# Patient Record
Sex: Male | Born: 1980 | Race: Black or African American | Hispanic: No | Marital: Single | Smoking: Never smoker
Health system: Southern US, Community
[De-identification: ages and names within clinical notes are randomized; demographics above are authoritative.]

## PROBLEM LIST (undated history)

## (undated) HISTORY — PX: OTHER SURGICAL HISTORY: SHX169

---

## 2015-06-19 ENCOUNTER — Emergency Department (HOSPITAL_COMMUNITY)
Admission: EM | Admit: 2015-06-19 | Discharge: 2015-06-20 | Disposition: A | Discharge status: Home / Self Care | Payer: BLUE CROSS/BLUE SHIELD | Attending: Emergency Medicine | Admitting: Emergency Medicine

## 2015-06-19 ENCOUNTER — Encounter (HOSPITAL_COMMUNITY): Payer: Self-pay | Admitting: Emergency Medicine

## 2015-06-19 DIAGNOSIS — M549 Dorsalgia, unspecified: Secondary | ICD-10-CM

## 2015-06-19 DIAGNOSIS — M546 Pain in thoracic spine: Secondary | ICD-10-CM | POA: Diagnosis not present

## 2015-06-19 MED ORDER — SODIUM CHLORIDE 0.9 % IV SOLN
1000.0000 mL | Freq: Once | INTRAVENOUS | Status: AC
  Administered 2015-06-20: 1000 mL via INTRAVENOUS

## 2015-06-19 MED ORDER — MORPHINE SULFATE (PF) 4 MG/ML IV SOLN
4.0000 mg | Freq: Once | INTRAVENOUS | Status: AC
  Administered 2015-06-20: 4 mg via INTRAVENOUS
  Filled 2015-06-19: qty 1

## 2015-06-19 MED ORDER — SODIUM CHLORIDE 0.9 % IV SOLN
1000.0000 mL | INTRAVENOUS | Status: DC
  Administered 2015-06-20: 1000 mL via INTRAVENOUS

## 2015-06-19 MED ORDER — ONDANSETRON HCL 4 MG/2ML IJ SOLN
4.0000 mg | Freq: Once | INTRAMUSCULAR | Status: AC
  Administered 2015-06-20: 4 mg via INTRAVENOUS
  Filled 2015-06-19: qty 2

## 2015-06-19 NOTE — ED Notes (Signed)
Per ems-- pt reports 2 hours ago sudden onset of mid back pain. Pt denies any heavy lifting today- he sat at a desk today at work. Pt occasionally has mid chest pain. Pt unable to sit still in bed.

## 2015-06-19 NOTE — ED Notes (Signed)
Pt now saying pain radiates to R upper abdomen/rib cage.

## 2015-06-19 NOTE — ED Provider Notes (Signed)
CSN: [id number]     Arrival date & time 06/19/15  2325 History   This chart was scribed for Dione Booze, MD by Arlan Organ, ED Scribe. This patient was seen in room D35C/D35C and the patient's care was started 11:39 PM.   Chief Complaint  Patient presents with  . Back Pain   The history is provided by the patient. No language interpreter was used.    HPI Comments: Samuel Bradford brought in by EMS is a 34 y.o. male without any pertinent past medical history who presents to the Emergency Department complaining of constant, sudden onset mid back pain onset 2 hours prior to arrival. Currently pain is rated 8-9/10. Denies any trauma or heavy lifting today or in last few months. No aggravating or alleviating factors at this time. OTC Ibuprofen attempted prior to arrival without any improvement for pain. No recent fever, chills, nausea, or abdominal pain. No previous history of same. No family or personal history of heart disease. He is not an every day smoker. No known allergies to medications.   PCP: He is not currently followed by a PCP  History reviewed. No pertinent past medical history. Past Surgical History  Procedure Laterality Date  . Head surgery      fractured skull   No family history on file. Social History  Substance Use Topics  . Smoking status: Never Smoker   . Smokeless tobacco: None  . Alcohol Use: Yes    Review of Systems  Constitutional: Negative for fever and chills.  Respiratory: Negative for cough and shortness of breath.   Cardiovascular: Negative for chest pain.  Gastrointestinal: Negative for nausea, vomiting and abdominal pain.  Musculoskeletal: Positive for back pain. Negative for neck pain.  Neurological: Negative for weakness, numbness and headaches.  Psychiatric/Behavioral: Negative for confusion.  All other systems reviewed and are negative.     Allergies  Review of patient's allergies indicates no known allergies.  Home Medications    Prior to Admission medications   Not on File   Triage Vitals: BP 142/71 mmHg  Pulse 75  Temp(Src) 98.1 F (36.7 C) (Oral)  Resp 16  Ht 5\' 7"  (1.702 m)  SpO2 100%   Physical Exam  Constitutional: He is oriented to person, place, and time. He appears well-developed and well-nourished.  HENT:  Head: Normocephalic and atraumatic.  Eyes: EOM are normal.  Neck: Normal range of motion.  Cardiovascular: Normal rate, regular rhythm, normal heart sounds and intact distal pulses.   Pulmonary/Chest: Effort normal and breath sounds normal. No respiratory distress.  Abdominal: Soft. He exhibits no distension. There is no tenderness.  Musculoskeletal: Normal range of motion.  Positive straight leg raise bilaterally at 60 degrees. Moderate bilateral paraspinal spasm.  Neurological: He is alert and oriented to person, place, and time.  Skin: Skin is warm and dry.  Psychiatric: He has a normal mood and affect. Judgment normal.  Nursing note and vitals reviewed.   ED Course  Procedures (including critical care time)  DIAGNOSTIC STUDIES: Oxygen Saturation is 100% on RA, Normal by my interpretation.    COORDINATION OF CARE: 11:44 PM- Will order BMP, CBC, CXR, CT CTA abd/pel with CM and/or without CM, CT abio chest aurta with CM and/or without CM, EKG, and  i-stat troponin I. Discussed treatment plan with pt at bedside and pt agreed to plan.     Labs Review Results for orders placed or performed during the hospital encounter of 06/19/15  Basic metabolic panel  Result Value  Ref Range   Sodium 138 135 - 145 mmol/L   Potassium 4.2 3.5 - 5.1 mmol/L   Chloride 104 101 - 111 mmol/L   CO2 26 22 - 32 mmol/L   Glucose, Bld 103 (H) 65 - 99 mg/dL   BUN 9 6 - 20 mg/dL   Creatinine, Ser 4.09 0.61 - 1.24 mg/dL   Calcium 9.5 8.9 - 81.1 mg/dL   GFR calc non Af Amer >60 >60 mL/min   GFR calc Af Amer >60 >60 mL/min   Anion gap 8 5 - 15  CBC  Result Value Ref Range   WBC 8.3 4.0 - 10.5 K/uL   RBC  4.95 4.22 - 5.81 MIL/uL   Hemoglobin 13.5 13.0 - 17.0 g/dL   HCT 91.4 78.2 - 95.6 %   MCV 81.6 78.0 - 100.0 fL   MCH 27.3 26.0 - 34.0 pg   MCHC 33.4 30.0 - 36.0 g/dL   RDW 21.3 08.6 - 57.8 %   Platelets 174 150 - 400 K/uL  Troponin I  Result Value Ref Range   Troponin I <0.03 <0.031 ng/mL  Differential  Result Value Ref Range   Neutrophils Relative % 60 43 - 77 %   Neutro Abs 4.9 1.7 - 7.7 K/uL   Lymphocytes Relative 31 12 - 46 %   Lymphs Abs 2.5 0.7 - 4.0 K/uL   Monocytes Relative 7 3 - 12 %   Monocytes Absolute 0.5 0.1 - 1.0 K/uL   Eosinophils Relative 2 0 - 5 %   Eosinophils Absolute 0.1 0.0 - 0.7 K/uL   Basophils Relative 0 0 - 1 %   Basophils Absolute 0.0 0.0 - 0.1 K/uL  I-Stat CG4 Lactic Acid, ED  Result Value Ref Range   Lactic Acid, Venous 1.22 0.5 - 2.0 mmol/L  I-stat troponin, ED  Result Value Ref Range   Troponin i, poc 0.00 0.00 - 0.08 ng/mL   Comment 3            Imaging Review Ct Angio Chest Aorta W/cm &/or Wo/cm  06/20/2015   CLINICAL DATA:  34 year old male with sudden onset of mid back pain.  EXAM: CT ANGIOGRAPHY CHEST, ABDOMEN AND PELVIS  TECHNIQUE: Multidetector CT imaging through the chest, abdomen and pelvis was performed using the standard protocol during bolus administration of intravenous contrast. Multiplanar reconstructed images and MIPs were obtained and reviewed to evaluate the vascular anatomy.  CONTRAST:  OMNIPAQUE IOHEXOL 300 MG/ML  SOLN  COMPARISON:  None.  FINDINGS: CTA CHEST FINDINGS  There is a 3 mm nodule in the right middle lobe (series 4, image 36). The lungs are otherwise clear. The central airways are patent. There is trace right pleural effusion.  The thoracic aorta is unremarkable. No CT evidence of pulmonary embolism. There is no cardiomegaly or pericardial effusion. No hilar or mediastinal adenopathy. The thyroid gland is unremarkable. Esophagus is collapsed.  There is no axillary adenopathy. The chest wall soft tissues are  unremarkable. The osseous structures are intact.  Review of the MIP images confirms the above findings.  CTA ABDOMEN AND PELVIS FINDINGS  No intra-abdominal free air or free fluid.  The liver, gallbladder, pancreas, spleen, adrenal glands, left kidney and left ureter appear unremarkable. 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 There is no hydronephrosis on either side. The urinary bladder, prostate and seminal vesicles appear unremarkable.  Moderate stool throughout  the colon with no evidence of bowel obstruction or inflammation. Multiple normal caliber fecalized loops of small bowel noted suggestive of chronic stasis. Normal appendix.  The abdominal aorta and IVC appear unremarkable. There is no abdominal aortic aneurysm or dissection. The origins of the celiac axis, SMA, IMA as well as the origins of the renal arteries are patent. Accessory left renal arteries noted. The right renal arteries arise from the aortic bifurcation as well as from the right common iliac artery. There is no lymphadenopathy.  There is diastases of anterior abdominal wall musculature in the midline with a small fat containing umbilical hernia. Two small fat containing supraumbilical hernia is noted. The osseous structures appear unremarkable.  Review of the MIP images confirms the above findings.  IMPRESSION: No CT evidence of pulmonary embolism or aortic dissection.  Ectopic right kidney.   Electronically Signed   By: Elgie Collard M.D.   On: 06/20/2015 01:16   Ct Cta Abd/pel W/cm &/or W/o Cm  06/20/2015   CLINICAL DATA:  34 year old male with sudden onset of mid back pain.  EXAM: CT ANGIOGRAPHY CHEST, ABDOMEN AND PELVIS  TECHNIQUE: Multidetector CT imaging through the chest, abdomen and pelvis was performed using the standard protocol during bolus administration of intravenous contrast. Multiplanar reconstructed images and MIPs  were obtained and reviewed to evaluate the vascular anatomy.  CONTRAST:  OMNIPAQUE IOHEXOL 300 MG/ML  SOLN  COMPARISON:  None.  FINDINGS: CTA CHEST FINDINGS  There is a 3 mm nodule in the right middle lobe (series 4, image 36). The lungs are otherwise clear. The central airways are patent. There is trace right pleural effusion.  The thoracic aorta is unremarkable. No CT evidence of pulmonary embolism. There is no cardiomegaly or pericardial effusion. No hilar or mediastinal adenopathy. The thyroid gland is unremarkable. Esophagus is collapsed.  There is no axillary adenopathy. The chest wall soft tissues are unremarkable. The osseous structures are intact.  Review of the MIP images confirms the above findings.  CTA ABDOMEN AND PELVIS FINDINGS  No intra-abdominal free air or free fluid.  The liver, gallbladder, pancreas, spleen, adrenal glands, left kidney and left ureter appear unremarkable. 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 There is no hydronephrosis on either side. The urinary bladder, prostate and seminal vesicles appear unremarkable.  Moderate stool throughout the colon with no evidence of bowel obstruction or inflammation. Multiple normal caliber fecalized loops of small bowel noted suggestive of chronic stasis. Normal appendix.  The abdominal aorta and IVC appear unremarkable. There is no abdominal aortic aneurysm or dissection. The origins of the celiac axis, SMA, IMA as well as the origins of the renal arteries are patent. Accessory left renal arteries noted. The right renal arteries arise from the aortic bifurcation as well as from the right common iliac artery. There is no lymphadenopathy.  There is diastases of anterior abdominal wall musculature in the midline with a small fat containing umbilical hernia. Two small fat containing supraumbilical hernia is noted. The osseous structures appear  unremarkable.  Review of the MIP images confirms the above findings.  IMPRESSION: No CT evidence of pulmonary embolism or aortic dissection.  Ectopic right kidney.   Electronically Signed   By: Elgie Collard M.D.   On: 06/20/2015 01:16   I have personally reviewed and evaluated these images and lab results as part of my medical decision-making.   EKG Interpretation  Date/Time:  Tuesday June 19 2015 23:34:06 EDT Ventricular Rate:  74 PR Interval:  170 QRS Duration: 84 QT Interval:  351 QTC Calculation: 389 R Axis:   72 Text Interpretation:  Sinus rhythm Consider left ventricular hypertrophy  Abnormal T, consider ischemia, diffuse leads Anterior ST elevation,  probably due to LVH ED PHYSICIAN INTERPRETATION AVAILABLE IN [facility] Confirmed by TEST, Record ([id number]) on 06/20/2015 6:53:26 AM      MDM   Final diagnoses:  Mid-back pain, acute    Pain in the midback of uncertain cause. No obvious musculoskeletal injury to precipitated. Although patient does not appear to be at increased risk, I feel he needs to be screened for possible aortic dissection. He was given a dose of morphine for pain and sent for CT angiogram which showed no evidence of dissection. There was a single 3 mm nodule noted in the right lung. Did discuss this with the radiologist and a nodule this size in a patient this age with no smoking history and does not need follow-up. With negative scans, it is felt that this still represents musculoskeletal pain even with lack of obvious precipitating event. He is discharged with prescriptions for naproxen, orphenadrine, and oxycodone-acetaminophen.  I personally performed the services described in this documentation, which was scribed in my presence. The recorded information has been reviewed and is accurate.     Dione Booze, MD 06/20/15 [phone]

## 2015-06-20 ENCOUNTER — Encounter (HOSPITAL_COMMUNITY): Payer: Self-pay | Admitting: Radiology

## 2015-06-20 ENCOUNTER — Emergency Department (HOSPITAL_COMMUNITY): Payer: BLUE CROSS/BLUE SHIELD

## 2015-06-20 LAB — DIFFERENTIAL
BASOS ABS: 0 10*3/uL (ref 0.0–0.1)
BASOS PCT: 0 % (ref 0–1)
EOS ABS: 0.1 10*3/uL (ref 0.0–0.7)
Eosinophils Relative: 2 % (ref 0–5)
LYMPHS ABS: 2.5 10*3/uL (ref 0.7–4.0)
Lymphocytes Relative: 31 % (ref 12–46)
Monocytes Absolute: 0.5 10*3/uL (ref 0.1–1.0)
Monocytes Relative: 7 % (ref 3–12)
NEUTROS PCT: 60 % (ref 43–77)
Neutro Abs: 4.9 10*3/uL (ref 1.7–7.7)

## 2015-06-20 LAB — CBC
HCT: 40.4 % (ref 39.0–52.0)
Hemoglobin: 13.5 g/dL (ref 13.0–17.0)
MCH: 27.3 pg (ref 26.0–34.0)
MCHC: 33.4 g/dL (ref 30.0–36.0)
MCV: 81.6 fL (ref 78.0–100.0)
PLATELETS: 174 10*3/uL (ref 150–400)
RBC: 4.95 MIL/uL (ref 4.22–5.81)
RDW: 13 % (ref 11.5–15.5)
WBC: 8.3 10*3/uL (ref 4.0–10.5)

## 2015-06-20 LAB — BASIC METABOLIC PANEL
Anion gap: 8 (ref 5–15)
BUN: 9 mg/dL (ref 6–20)
CHLORIDE: 104 mmol/L (ref 101–111)
CO2: 26 mmol/L (ref 22–32)
CREATININE: 1.08 mg/dL (ref 0.61–1.24)
Calcium: 9.5 mg/dL (ref 8.9–10.3)
GFR calc non Af Amer: >60 mL/min (ref >60)
Glucose, Bld: 103 mg/dL — ABNORMAL HIGH (ref 65–99)
POTASSIUM: 4.2 mmol/L (ref 3.5–5.1)
SODIUM: 138 mmol/L (ref 135–145)

## 2015-06-20 LAB — I-STAT TROPONIN, ED: TROPONIN I, POC: 0 ng/mL (ref 0.00–0.08)

## 2015-06-20 LAB — TROPONIN I

## 2015-06-20 LAB — I-STAT CG4 LACTIC ACID, ED: Lactic Acid, Venous: 1.22 mmol/L (ref 0.5–2.0)

## 2015-06-20 MED ORDER — NAPROXEN 500 MG PO TABS: 500.0000 mg | ORAL_TABLET | Freq: Two times a day (BID) | ORAL | Status: DC

## 2015-06-20 MED ORDER — IBUPROFEN 800 MG PO TABS: 800.0000 mg | ORAL_TABLET | Freq: Once | ORAL | Status: DC

## 2015-06-20 MED ORDER — CYCLOBENZAPRINE HCL 10 MG PO TABS: 10.0000 mg | ORAL_TABLET | Freq: Once | ORAL | Status: DC

## 2015-06-20 MED ORDER — OXYCODONE-ACETAMINOPHEN 5-325 MG PO TABS: 1.0000 | ORAL_TABLET | ORAL | Status: DC | PRN

## 2015-06-20 MED ORDER — IOHEXOL 300 MG/ML  SOLN: 25.0000 mL | INTRAMUSCULAR | Status: AC

## 2015-06-20 MED ORDER — ORPHENADRINE CITRATE ER 100 MG PO TB12: 100.0000 mg | ORAL_TABLET | Freq: Two times a day (BID) | ORAL | Status: DC

## 2015-06-20 MED ORDER — IOHEXOL 300 MG/ML  SOLN
100.0000 mL | Freq: Once | INTRAMUSCULAR | Status: DC | PRN
  Administered 2015-06-20: 100 mL via INTRAVENOUS
  Filled 2015-06-20: qty 100

## 2015-06-20 NOTE — Discharge Instructions (Signed)
Back Pain, Adult °Low back pain is very common. About 1 in 5 people have back pain. The cause of low back pain is rarely dangerous. The pain often gets better over time. About half of people with a sudden onset of back pain feel better in just 2 weeks. About 8 in 10 people feel better by 6 weeks.  °CAUSES °Some common causes of back pain include: °· Strain of the muscles or ligaments supporting the spine. °· Wear and tear (degeneration) of the spinal discs. °· Arthritis. °· Direct injury to the back. °DIAGNOSIS °Most of the time, the direct cause of low back pain is not known. However, back pain can be treated effectively even when the exact cause of the pain is unknown. Answering your caregiver's questions about your overall health and symptoms is one of the most accurate ways to make sure the cause of your pain is not dangerous. If your caregiver needs more information, he or she may order lab work or imaging tests (X-rays or MRIs). However, even if imaging tests show changes in your back, this usually does not require surgery. °HOME CARE INSTRUCTIONS °For many people, back pain returns. Since low back pain is rarely dangerous, it is often a condition that people can learn to manage on their own.  °· Remain active. It is stressful on the back to sit or stand in one place. Do not sit, drive, or stand in one place for more than 30 minutes at a time. Take short walks on level surfaces as soon as pain allows. Try to increase the length of time you walk each day. °· Do not stay in bed. Resting more than 1 or 2 days can delay your recovery. °· Do not avoid exercise or work. Your body is made to move. It is not dangerous to be active, even though your back may hurt. Your back will likely heal faster if you return to being active before your pain is gone. °· Pay attention to your body when you  bend and lift. Many people have less discomfort when lifting if they bend their knees, keep the load close to their bodies, and  avoid twisting. Often, the most comfortable positions are those that put less stress on your recovering back. °· Find a comfortable position to sleep. Use a firm mattress and lie on your side with your knees slightly bent. If you lie on your back, put a pillow under your knees. °· Only take over-the-counter or prescription medicines as directed by your caregiver. Over-the-counter medicines to reduce pain and inflammation are often the most helpful. Your caregiver may prescribe muscle relaxant drugs. These medicines help dull your pain so you can more quickly return to your normal activities and healthy exercise. °· Put ice on the injured area. °¨ Put ice in a plastic bag. °¨ Place a towel between your skin and the bag. °¨ Leave the ice on for 15-20 minutes, 03-04 times a day for the first 2 to 3 days. After that, ice and heat may be alternated to reduce pain and spasms. °· Ask your caregiver about trying back exercises and gentle massage. This may be of some benefit. °· Avoid feeling anxious or stressed. Stress increases muscle tension and can worsen back pain. It is important to recognize when you are anxious or stressed and learn ways to manage it. Exercise is a great option. °SEEK MEDICAL CARE IF: °· You have pain that is not relieved with rest or medicine. °· You have pain that does not improve in 1 week. °· You have new symptoms. °· You are generally not feeling well. °SEEK   IMMEDIATE MEDICAL CARE IF:  °· You have pain that radiates from your back into your legs. °· You develop new bowel or bladder control problems. °· You have unusual weakness or numbness in your arms or legs. °· You develop nausea or vomiting. °· You develop abdominal pain. °· You feel faint. °Document Released: 09/29/2005 Document Revised: 03/30/2012 Document Reviewed: 01/31/2014 °[employer]® Patient Information ©2015 [employer]. This information is not intended to replace advice given to you by your health care provider. Make sure you  discuss any questions you have with your health care provider. ° °Naproxen and naproxen sodium oral immediate-release tablets °What is this medicine? °NAPROXEN (na PROX en) is a non-steroidal anti-inflammatory drug (NSAID). It is used to reduce swelling and to treat pain. This medicine may be used for dental pain, headache, or painful monthly periods. It is also used for painful joint and muscular problems such as arthritis, tendinitis, bursitis, and gout. °This medicine may be used for other purposes; ask your health care provider or pharmacist if you have questions. °COMMON BRAND NAME(S): Aflaxen, Aleve, Aleve Arthritis, All Day Relief, Anaprox, Anaprox DS, Naprosyn °What should I tell my health care provider before I take this medicine? °They need to know if you have any of these conditions: °-asthma °-cigarette smoker °-drink more than 3 alcohol containing drinks a day °-heart disease or circulation problems such as heart failure or leg edema (fluid retention) °-high blood pressure °-kidney disease °-liver disease °-stomach bleeding or ulcers °-an unusual or allergic reaction to naproxen, aspirin, other NSAIDs, other medicines, foods, dyes, or preservatives °-pregnant or trying to get pregnant °-breast-feeding °How should I use this medicine? °Take this medicine by mouth with a glass of water. Follow the directions on the prescription label. Take it with food if your stomach gets upset. Try to not lie down for at least 10 minutes after you take it. Take your medicine at regular intervals. Do not take your medicine more often than directed. Long-term, continuous use may increase the risk of heart attack or stroke. °A special MedGuide will be given to you by the pharmacist with each prescription and refill. Be sure to read this information carefully each time. °Talk to your pediatrician regarding the use of this medicine in children. Special care may be needed. °Overdosage: If you think you have taken too much of  this medicine contact a poison control center or emergency room at once. °NOTE: This medicine is only for you. Do not share this medicine with others. °What if I miss a dose? °If you miss a dose, take it as soon as you can. If it is almost time for your next dose, take only that dose. Do not take double or extra doses. °What may interact with this medicine? °-alcohol °-aspirin °-cidofovir °-diuretics °-lithium °-methotrexate °-other drugs for inflammation like ketorolac or prednisone °-pemetrexed °-probenecid °-warfarin °This list may not describe all possible interactions. Give your health care provider a list of all the medicines, herbs, non-prescription drugs, or dietary supplements you use. Also tell them if you smoke, drink alcohol, or use illegal drugs. Some items may interact with your medicine. °What should I watch for while using this medicine? °Tell your doctor or health care professional if your pain does not get better. Talk to your doctor before taking another medicine for pain. Do not treat yourself. °This medicine does not prevent heart attack or stroke. In fact, this medicine may increase the chance of a heart attack or stroke. The chance may increase   with longer use of this medicine and in people who have heart disease. If you take aspirin to prevent heart attack or stroke, talk with your doctor or health care professional. °Do not take other medicines that contain aspirin, ibuprofen, or naproxen with this medicine. Side effects such as stomach upset, nausea, or ulcers may be more likely to occur. Many medicines available without a prescription should not be taken with this medicine. °This medicine can cause ulcers and bleeding in the stomach and intestines at any time during treatment. Do not smoke cigarettes or drink alcohol. These increase irritation to your stomach and can make it more susceptible to damage from this medicine. Ulcers and bleeding can happen without warning symptoms and can cause  death. °You may get drowsy or dizzy. Do not drive, use machinery, or do anything that needs mental alertness until you know how this medicine affects you. Do not stand or sit up quickly, especially if you are an older patient. This reduces the risk of dizzy or fainting spells. °This medicine can cause you to bleed more easily. Try to avoid damage to your teeth and gums when you brush or floss your teeth. °What side effects may I notice from receiving this medicine? °Side effects that you should report to your doctor or health care professional as soon as possible: °-black or bloody stools, blood in the urine or vomit °-blurred vision °-chest pain °-difficulty breathing or wheezing °-nausea or vomiting °-severe stomach pain °-skin rash, skin redness, blistering or peeling skin, hives, or itching °-slurred speech or weakness on one side of the body °-swelling of eyelids, throat, lips °-unexplained weight gain or swelling °-unusually weak or tired °-yellowing of eyes or skin °Side effects that usually do not require medical attention (report to your doctor or health care professional if they continue or are bothersome): °-constipation °-headache °-heartburn °This list may not describe all possible side effects. Call your doctor for medical advice about side effects. You may report side effects to FDA at 1-800-FDA-1088. °Where should I keep my medicine? °Keep out of the reach of children. °Store at room temperature between 15 and 30 degrees C (59 and 86 degrees F). Keep container tightly closed. Throw away any unused medicine after the expiration date. °NOTE: This sheet is a summary. It may not cover all possible information. If you have questions about this medicine, talk to your doctor, pharmacist, or health care provider. °© 2015, [employer]. (2009-10-01 20:10:16) ° °Orphenadrine tablets °What is this medicine? °ORPHENADRINE (or FEN a dreen) helps to relieve pain and stiffness in muscles and can treat  muscle spasms. °This medicine may be used for other purposes; ask your health care provider or pharmacist if you have questions. °COMMON BRAND NAME(S): Norflex °What should I tell my health care provider before I take this medicine? °They need to know if you have any of these conditions: °-glaucoma °-heart disease °-kidney disease °-myasthenia gravis °-peptic ulcer disease °-prostate disease °-stomach problems °-an unusual or allergic reaction to orphenadrine, other medicines, foods, lactose, dyes, or preservatives °-pregnant or trying to get pregnant °-breast-feeding °How should I use this medicine? °Take this medicine by mouth with a full glass of water. Follow the directions on the prescription label. Take your medicine at regular intervals. Do not take your medicine more often than directed. Do not take more than you are told to take. °Talk to your pediatrician regarding the use of this medicine in children. Special care may be needed. °Patients over 65 years old   may have a stronger reaction and need a smaller dose. °Overdosage: If you think you have taken too much of this medicine contact a poison control center or emergency room at once. °NOTE: This medicine is only for you. Do not share this medicine with others. °What if I miss a dose? °If you miss a dose, take it as soon as you can. If it is almost time for your next dose, take only that dose. Do not take double or extra doses. °What may interact with this medicine? °-alcohol °-antihistamines °-barbiturates, like phenobarbital °-benzodiazepines °-cyclobenzaprine °-medicines for pain °-phenothiazines like chlorpromazine, mesoridazine, prochlorperazine, thioridazine °This list may not describe all possible interactions. Give your health care provider a list of all the medicines, herbs, non-prescription drugs, or dietary supplements you use. Also tell them if you smoke, drink alcohol, or use illegal drugs. Some items may interact with your medicine. °What  should I watch for while using this medicine? °Your mouth may get dry. Chewing sugarless gum or sucking hard candy, and drinking plenty of water may help. Contact your doctor if the problem does not go away or is severe. °This medicine may cause dry eyes and blurred vision. If you wear contact lenses you may feel some discomfort. Lubricating drops may help. See your eye doctor if the problem does not go away or is severe. °You may get drowsy or dizzy. Do not drive, use machinery, or do anything that needs mental alertness until you know how this medicine affects you. Do not stand or sit up quickly, especially if you are an older patient. This reduces the risk of dizzy or fainting spells. Alcohol may interfere with the effect of this medicine. Avoid alcoholic drinks. °What side effects may I notice from receiving this medicine? °Side effects that you should report to your doctor or health care professional as soon as possible: °-allergic reactions like skin rash, itching or hives, swelling of the face, lips, or tongue °-changes in vision °-difficulty breathing °-fast heartbeat or palpitations °-hallucinations °-light headedness, fainting spells °-vomiting °Side effects that usually do not require medical attention (report to your doctor or health care professional if they continue or are bothersome): °-dizziness °-drowsiness °-headache °-nausea °This list may not describe all possible side effects. Call your doctor for medical advice about side effects. You may report side effects to FDA at 1-800-FDA-1088. °Where should I keep my medicine? °Keep out of the reach of children. °Store at room temperature between 15 and 30 degrees C (59 and 86 degrees F). Protect from light. Keep container tightly closed. Throw away any unused medicine after the expiration date. °NOTE: This sheet is a summary. It may not cover all possible information. If you have questions about this medicine, talk to your doctor, pharmacist, or health  care provider. °© 2015, [employer]. (2008-04-25 17:19:12) ° °Acetaminophen; Oxycodone tablets °What is this medicine? °ACETAMINOPHEN; OXYCODONE (a set a MEE noe fen; ox i KOE done) is a pain reliever. It is used to treat mild to moderate pain. °This medicine may be used for other purposes; ask your health care provider or pharmacist if you have questions. °COMMON BRAND NAME(S): Endocet, Magnacet, Narvox, Percocet, Perloxx, Primalev, Primlev, Roxicet, Xolox °What should I tell my health care provider before I take this medicine? °They need to know if you have any of these conditions: °-brain tumor °-Crohn's disease, inflammatory bowel disease, or ulcerative colitis °-drug abuse or addiction °-head injury °-heart or circulation problems °-if you often drink alcohol °-kidney disease or problems   going to the bathroom °-liver disease °-lung disease, asthma, or breathing problems °-an unusual or allergic reaction to acetaminophen, oxycodone, other opioid analgesics, other medicines, foods, dyes, or preservatives °-pregnant or trying to get pregnant °-breast-feeding °How should I use this medicine? °Take this medicine by mouth with a full glass of water. Follow the directions on the prescription label. Take your medicine at regular intervals. Do not take your medicine more often than directed. °Talk to your pediatrician regarding the use of this medicine in children. Special care may be needed. °Patients over 65 years old may have a stronger reaction and need a smaller dose. °Overdosage: If you think you have taken too much of this medicine contact a poison control center or emergency room at once. °NOTE: This medicine is only for you. Do not share this medicine with others. °What if I miss a dose? °If you miss a dose, take it as soon as you can. If it is almost time for your next dose, take only that dose. Do not take double or extra doses. °What may interact with this  medicine? °-alcohol °-antihistamines °-barbiturates like amobarbital, butalbital, butabarbital, methohexital, pentobarbital, phenobarbital, thiopental, and secobarbital °-benztropine °-drugs for bladder problems like solifenacin, trospium, oxybutynin, tolterodine, hyoscyamine, and methscopolamine °-drugs for breathing problems like ipratropium and tiotropium °-drugs for certain stomach or intestine problems like propantheline, homatropine methylbromide, glycopyrrolate, atropine, belladonna, and dicyclomine °-general anesthetics like etomidate, ketamine, nitrous oxide, propofol, desflurane, enflurane, halothane, isoflurane, and sevoflurane °-medicines for depression, anxiety, or psychotic disturbances °-medicines for sleep °-muscle relaxants °-naltrexone °-narcotic medicines (opiates) for pain °-phenothiazines like perphenazine, thioridazine, chlorpromazine, mesoridazine, fluphenazine, prochlorperazine, promazine, and trifluoperazine °-scopolamine °-tramadol °-trihexyphenidyl °This list may not describe all possible interactions. Give your health care provider a list of all the medicines, herbs, non-prescription drugs, or dietary supplements you use. Also tell them if you smoke, drink alcohol, or use illegal drugs. Some items may interact with your medicine. °What should I watch for while using this medicine? °Tell your doctor or health care professional if your pain does not go away, if it gets worse, or if you have new or a different type of pain. You may develop tolerance to the medicine. Tolerance means that you will need a higher dose of the medication for pain relief. Tolerance is normal and is expected if you take this medicine for a long time. °Do not suddenly stop taking your medicine because you may develop a severe reaction. Your body becomes used to the medicine. This does NOT mean you are addicted. Addiction is a behavior related to getting and using a drug for a non-medical reason. If you have pain, you  have a medical reason to take pain medicine. Your doctor will tell you how much medicine to take. If your doctor wants you to stop the medicine, the dose will be slowly lowered over time to avoid any side effects. °You may get drowsy or dizzy. Do not drive, use machinery, or do anything that needs mental alertness until you know how this medicine affects you. Do not stand or sit up quickly, especially if you are an older patient. This reduces the risk of dizzy or fainting spells. Alcohol may interfere with the effect of this medicine. Avoid alcoholic drinks. °There are different types of narcotic medicines (opiates) for pain. If you take more than one type at the same time, you may have more side effects. Give your health care provider a list of all medicines you use. Your doctor will tell you how   much medicine to take. Do not take more medicine than directed. Call emergency for help if you have problems breathing. °The medicine will cause constipation. Try to have a bowel movement at least every 2 to 3 days. If you do not have a bowel movement for 3 days, call your doctor or health care professional. °Do not take Tylenol (acetaminophen) or medicines that have acetaminophen with this medicine. Too much acetaminophen can be very dangerous. Many nonprescription medicines contain acetaminophen. Always read the labels carefully to avoid taking more acetaminophen. °What side effects may I notice from receiving this medicine? °Side effects that you should report to your doctor or health care professional as soon as possible: °-allergic reactions like skin rash, itching or hives, swelling of the face, lips, or tongue °-breathing difficulties, wheezing °-confusion °-light headedness or fainting spells °-severe stomach pain °-unusually weak or tired °-yellowing of the skin or the whites of the eyes °Side effects that usually do not require medical attention (report to your doctor or health care professional if they continue  or are bothersome): °-dizziness °-drowsiness °-nausea °-vomiting °This list may not describe all possible side effects. Call your doctor for medical advice about side effects. You may report side effects to FDA at 1-800-FDA-1088. °Where should I keep my medicine? °Keep out of the reach of children. This medicine can be abused. Keep your medicine in a safe place to protect it from theft. Do not share this medicine with anyone. Selling or giving away this medicine is dangerous and against the law. °Store at room temperature between 20 and 25 degrees C (68 and 77 degrees F). Keep container tightly closed. Protect from light. °This medicine may cause accidental overdose and death if it is taken by other adults, children, or pets. Flush any unused medicine down the toilet to reduce the chance of harm. Do not use the medicine after the expiration date. °NOTE: This sheet is a summary. It may not cover all possible information. If you have questions about this medicine, talk to your doctor, pharmacist, or health care provider. °© 2015, [employer]. (2013-05-23 13:17:35) ° °

## 2015-06-20 NOTE — ED Notes (Signed)
Patient transported to CT 

## 2017-03-17 ENCOUNTER — Encounter: Payer: Self-pay | Admitting: Urgent Care

## 2017-03-17 ENCOUNTER — Ambulatory Visit (INDEPENDENT_AMBULATORY_CARE_PROVIDER_SITE_OTHER): Payer: Worker's Compensation

## 2017-03-17 ENCOUNTER — Ambulatory Visit (INDEPENDENT_AMBULATORY_CARE_PROVIDER_SITE_OTHER): Payer: Worker's Compensation | Admitting: Urgent Care

## 2017-03-17 VITALS — BP 121/67 | HR 79 | Temp 97.4°F | Resp 16 | Ht 65.5 in | Wt 187.2 lb

## 2017-03-17 DIAGNOSIS — M545 Low back pain, unspecified: Secondary | ICD-10-CM

## 2017-03-17 DIAGNOSIS — M62830 Muscle spasm of back: Secondary | ICD-10-CM | POA: Diagnosis not present

## 2017-03-17 DIAGNOSIS — S39012A Strain of muscle, fascia and tendon of lower back, initial encounter: Secondary | ICD-10-CM | POA: Diagnosis not present

## 2017-03-17 MED ORDER — CYCLOBENZAPRINE HCL 5 MG PO TABS: 5.0000 mg | ORAL_TABLET | Freq: Three times a day (TID) | ORAL | 1 refills | Status: AC | PRN

## 2017-03-17 MED ORDER — NAPROXEN SODIUM 550 MG PO TABS: 550.0000 mg | ORAL_TABLET | Freq: Two times a day (BID) | ORAL | 1 refills | Status: AC

## 2017-03-17 NOTE — Patient Instructions (Addendum)
Low Back Sprain A sprain is a stretch or tear in the bands of tissue that hold bones and joints together (ligaments). Sprains of the lower back (lumbar spine) are a common cause of low back pain. A sprain occurs when ligaments are overextended or stretched beyond their limits. The ligaments can become inflamed, resulting in pain and sudden muscle tightening (spasms). A sprain can be caused by an injury (trauma), or it can develop gradually due to overuse. There are three types of sprains:  Grade 1 is a mild sprain involving an overstretched ligament or a very slight tear of the ligament.  Grade 2 is a moderate sprain involving a partial tear of the ligament.  Grade 3 is a severe sprain involving a complete tear of the ligament.  What are the causes? This condition may be caused by:  Trauma, such as a fall or a hit to the body.  Twisting or overstretching the back. This may result from doing activities that require a lot of energy, such as lifting heavy objects.  What increases the risk? The following factors may increase your risk of getting this condition:  Playing contact sports.  Participating in sports or activities that put excessive stress on the back and require a lot of bending and twisting, including: ? Lifting weights or heavy objects. ? Gymnastics. ? Soccer. ? Figure skating. ? Snowboarding.  Being overweight or obese.  Having poor strength and flexibility.  What are the signs or symptoms? Symptoms of this condition may include:  Sharp or dull pain in the lower back that does not go away. Pain may extend to the buttocks.  Stiffness.  Limited range of motion.  Inability to stand up straight due to stiffness or pain.  Muscle spasms.  How is this diagnosed?  This condition may be diagnosed based on:  Your symptoms.  Your medical history.  A physical exam. ? Your health care provider may push on certain areas of your back to determine the source of your  pain. ? You may be asked to bend forward, backward, and side to side to assess the severity of your pain and your range of motion.  Imaging tests, such as: ? X-rays. ? MRI.  How is this treated? Treatment for this condition may include:  Applying heat and cold to the affected area.  Medicines to help relieve pain and to relax your muscles (muscle relaxants).  NSAIDs to help reduce swelling and discomfort.  Physical therapy.  When your symptoms improve, it is important to gradually return to your normal routine as soon as possible to reduce pain, avoid stiffness, and avoid loss of muscle strength. Generally, symptoms should improve within 6 weeks of treatment. However, recovery time varies. Follow these instructions at home: Managing pain, stiffness, and swelling  If directed, apply ice to the injured area during the first 24 hours after your injury. ? Put ice in a plastic bag. ? Place a towel between your skin and the bag. ? Leave the ice on for 20 minutes, 2-3 times a day.  If directed, apply heat to the affected area as often as told by your health care provider. Use the heat source that your health care provider recommends, such as a moist heat pack or a heating pad. ? Place a towel between your skin and the heat source. ? Leave the heat on for 20-30 minutes. ? Remove the heat if your skin turns bright red. This is especially important if you are unable to feel pain,  heat, or cold. You may have a greater risk of getting burned. Activity  Rest and return to your normal activities as told by your health care provider. Ask your health care provider what activities are safe for you.  Avoid activities that take a lot of effort (are strenuous) for as long as told by your health care provider.  Do exercises as told by your health care provider. General instructions   Take over-the-counter and prescription medicines only as told by your health care provider.  If you have  questions or concerns about safety while taking pain medicine, talk with your health care provider.  Do not drive or operate heavy machinery until you know how your pain medicine affects you.  Do not use any tobacco products, such as cigarettes, chewing tobacco, and e-cigarettes. Tobacco can delay bone healing. If you need help quitting, ask your health care provider.  Keep all follow-up visits as told by your health care provider. This is important. How is this prevented?  Warm up and stretch before being active.  Cool down and stretch after being active.  Give your body time to rest between periods of activity.  Avoid: ? Being physically inactive for long periods at a time. ? Exercising or playing sports when you are tired or in pain.  Use correct form when playing sports and lifting heavy objects.  Use good posture when sitting and standing.  Maintain a healthy weight.  Sleep on a mattress with medium firmness to support your back.  Make sure to use equipment that fits you, including shoes that fit well.  Be safe and responsible while being active to avoid falls.  Do at least 150 minutes of moderate-intensity exercise each week, such as brisk walking or water aerobics. Try a form of exercise that takes stress off your back, such as swimming or stationary cycling.  Maintain physical fitness, including: ? Strength. In particular, develop and maintain strong abdominal muscles. ? Flexibility. ? Cardiovascular fitness. ? Endurance. Contact a health care provider if:  Your back pain does not improve after 6 weeks of treatment.  Your symptoms get worse. Get help right away if:  Your back pain is severe.  You are unable to stand or walk.  You develop pain in your legs.  You develop weakness in your buttocks or legs.  You have difficulty controlling when you urinate or when you have a bowel movement. This information is not intended to replace advice given to you by  your health care provider. Make sure you discuss any questions you have with your health care provider. Document Released: 09/29/2005 Document Revised: 06/05/2016 Document Reviewed: 07/11/2015 Elsevier Interactive Patient Education  2018 [employer]     IF you received an x-ray today, you will receive an invoice from [facility]. Please contact [facility] at [phone] with questions or concerns regarding your invoice.   IF you received labwork today, you will receive an invoice from [facility]. Please contact [facility] at [phone] with questions or concerns regarding your invoice.   Our billing staff will not be able to assist you with questions regarding bills from these companies.  You will be contacted with the lab results as soon as they are available. The fastest way to get your results is to activate your My Chart account. Instructions are located on the last page of this paperwork. If you have not heard from us regarding the results in 2 weeks, please contact this office.

## 2017-03-17 NOTE — Progress Notes (Signed)
   MRN: [id number] DOB: 11/24/1980  Subjective:   Pete Peltrimiyau B Philippi is a 36 y.o. male presenting for worker's comp visit.   Reports suffering low back injury while lifting a washer while at work on 03/07/2017. He felt immediate sharp pain, hot sensation of his back. Patient notified his manager, was allowed to rest off the floor for a few minutes. When he returned to work his back pain worsened. The pain has now progressed to radiate down into his right lateral thigh. Has tried ibuprofen, otc NSAID medications for relief. Denies falls, trauma, numbness or tingling, weakness.   Bryson's medications list, allergies, past medical history and past surgical history were reviewed and excluded from this note due to being a worker's comp case.  Objective:   Vitals: BP 121/67 (BP Location: Right Arm, Patient Position: Sitting, Cuff Size: Normal)   Pulse 79   Temp 97.4 F (36.3 C) (Oral)   Resp 16   Ht 5' 5.5" (1.664 m)   Wt 187 lb 3.2 oz (84.9 kg)   SpO2 95%   BMI 30.68 kg/m   Physical Exam  Constitutional: He is oriented to person, place, and time. He appears well-developed and well-nourished.  Cardiovascular: Normal rate.   Pulmonary/Chest: Effort normal.  Musculoskeletal:       Lumbar back: He exhibits decreased range of motion (flexion, extension), tenderness (over entire lower back, worst over lumbar paraspinal muscles) and spasm. He exhibits no bony tenderness, no swelling, no edema, no deformity and no laceration.  Neurological: He is alert and oriented to person, place, and time. He displays normal reflexes. Coordination (favoring back significantly, rising slowly from seated position, lays down on exam table with pain) abnormal.   Dg Lumbar Spine Complete  Result Date: 03/17/2017 CLINICAL DATA:  Acute bilateral low back pain without sciatica EXAM: LUMBAR SPINE - COMPLETE 4+ VIEW COMPARISON:  Coronal and sagittal images through the lumbar spine from an abdominal and pelvic CT scan  dated June 20, 2015 FINDINGS: The lumbar vertebral bodies are preserved in height. The pedicles and transverse processes are intact. The disc space heights are well maintained. There is no spondylolisthesis. The observed portions of the sacrum are normal. IMPRESSION: There is no acute or significant chronic bony abnormality of the lumbar spine. Electronically Signed   By: David  SwazilandJordan M.D.   On: 03/17/2017 16:28    Assessment and Plan :   1. Strain of lumbar region, initial encounter 2. Acute bilateral low back pain without sciatica 3. Spasm of muscle of lower back - Radiology report reassuring. Will start conservative management with Anaprox, Flexeril. Work restrictions provided. Patient to f/u in 2 weeks or sooner if no improvement/worsening symptoms. Consider referral to ortho at that point for consideration of further imaging or PT.  Wallis BambergMario Janeshia Ciliberto, PA-C [facility] [phone] 03/17/2017 4:18 PM

## 2017-03-21 ENCOUNTER — Encounter: Payer: Self-pay | Admitting: Physician Assistant

## 2017-03-21 ENCOUNTER — Ambulatory Visit (INDEPENDENT_AMBULATORY_CARE_PROVIDER_SITE_OTHER): Payer: BLUE CROSS/BLUE SHIELD | Admitting: Physician Assistant

## 2017-03-21 VITALS — BP 128/75 | HR 70 | Temp 98.2°F | Resp 20 | Ht 66.5 in | Wt 185.5 lb

## 2017-03-21 DIAGNOSIS — Z113 Encounter for screening for infections with a predominantly sexual mode of transmission: Secondary | ICD-10-CM

## 2017-03-21 DIAGNOSIS — Z114 Encounter for screening for human immunodeficiency virus [HIV]: Secondary | ICD-10-CM | POA: Diagnosis not present

## 2017-03-21 DIAGNOSIS — Z1389 Encounter for screening for other disorder: Secondary | ICD-10-CM | POA: Diagnosis not present

## 2017-03-21 DIAGNOSIS — Z1159 Encounter for screening for other viral diseases: Secondary | ICD-10-CM

## 2017-03-21 DIAGNOSIS — Z130 Encounter for screening for diseases of the blood and blood-forming organs and certain disorders involving the immune mechanism: Secondary | ICD-10-CM

## 2017-03-21 DIAGNOSIS — Z0000 Encounter for general adult medical examination without abnormal findings: Secondary | ICD-10-CM

## 2017-03-21 DIAGNOSIS — Z131 Encounter for screening for diabetes mellitus: Secondary | ICD-10-CM | POA: Diagnosis not present

## 2017-03-21 LAB — POCT URINALYSIS DIP (MANUAL ENTRY)
Bilirubin, UA: NEGATIVE
GLUCOSE UA: NEGATIVE mg/dL
Ketones, POC UA: NEGATIVE mg/dL
Leukocytes, UA: NEGATIVE
NITRITE UA: NEGATIVE
PH UA: 7 (ref 5.0–8.0)
Protein Ur, POC: NEGATIVE mg/dL
RBC UA: NEGATIVE
Spec Grav, UA: 1.02 (ref 1.010–1.025)
UROBILINOGEN UA: 0.2 U/dL

## 2017-03-21 NOTE — Progress Notes (Signed)
03/21/2017 9:09 AM   DOB: Dec 11, 1980 / MRN: [id number]  SUBJECTIVE:  Samuel Bradford is a 36 y.o. male presenting for an annual physical.  He was recently told he has "hepatitis."  He does not know what type of hepatitis this may be. He has had an annual physical.  He feels well today. He likes to exercise via jogging and weight lifting.  He does these activities 3 times weekly for 1-2 hours. Family history of DM. He is sexually active with females only.  He does not smoke.  Denies a history of IV drug use. He works at [employer] and is full time and works about 40 hours weekly. He says that he a happy person.  He does drink but not daily.  He may have a drink 1-2 times weekly, but does not get drunk. He drinks spirits only and may have 4-5 shots in one sitting. No family history of HTN, CAD, stroke. He has been largely healthy.  Did have an head injury 17 years ago.   Depression screen PHQ 2/9 03/21/2017  Decreased Interest 0  Down, Depressed, Hopeless 0  PHQ - 2 Score 0   He has No Known Allergies.   He  has no past medical history on file.    He  reports that he has never smoked. He has never used smokeless tobacco. He reports that he drinks alcohol. He reports that he does not use drugs.  The patient  has a past surgical history that includes head surgery.  His family history includes Diabetes in his father.  Review of Systems  Constitutional: Negative for chills, diaphoresis and fever.  Eyes: Negative.   Respiratory: Negative for cough, hemoptysis, sputum production, shortness of breath and wheezing.   Cardiovascular: Negative for chest pain, orthopnea and leg swelling.  Gastrointestinal: Negative for abdominal pain, blood in stool, constipation, diarrhea, heartburn, melena, nausea and vomiting.  Genitourinary: Negative for dysuria, flank pain, frequency, hematuria and urgency.  Skin: Negative for rash.  Neurological: Negative for dizziness, sensory change, speech  change, focal weakness and headaches.    The problem list and medications were reviewed and updated by myself where necessary and exist elsewhere in the encounter.   OBJECTIVE:  BP 128/75   Pulse 70   Temp 98.2 F (36.8 C) (Oral)   Resp 20   Ht 5' 6.5" (1.689 m)   Wt 185 lb 8 oz (84.1 kg)   SpO2 99%   BMI 29.49 kg/m   Physical Exam  Constitutional: He is oriented to person, place, and time. He appears well-developed. He is active and cooperative.  Non-toxic appearance.  HENT:  Right Ear: Hearing, tympanic membrane, external ear and ear canal normal.  Left Ear: Hearing, tympanic membrane, external ear and ear canal normal.  Nose: Nose normal. Right sinus exhibits no maxillary sinus tenderness and no frontal sinus tenderness. Left sinus exhibits no maxillary sinus tenderness and no frontal sinus tenderness.  Mouth/Throat: Uvula is midline, oropharynx is clear and moist and mucous membranes are normal. No oropharyngeal exudate, posterior oropharyngeal edema or tonsillar abscesses.  Eyes: Conjunctivae and EOM are normal. Pupils are equal, round, and reactive to light.  Cardiovascular: Normal rate, regular rhythm, S1 normal, S2 normal, normal heart sounds, intact distal pulses and normal pulses.  Exam reveals no gallop and no friction rub.   No murmur heard. Pulmonary/Chest: Effort normal. No stridor. No tachypnea. No respiratory distress. He has no wheezes. He has no rales.  Abdominal: Soft.  Normal appearance and bowel sounds are normal. He exhibits no distension and no mass. There is no tenderness. There is no rigidity, no rebound, no guarding and no CVA tenderness. No hernia.  Musculoskeletal: He exhibits no edema.  Lymphadenopathy:       Head (right side): No submandibular and no tonsillar adenopathy present.       Head (left side): No submandibular and no tonsillar adenopathy present.    He has no cervical adenopathy.  Neurological: He is alert and oriented to person, place, and  time. He has normal strength and normal reflexes. He is not disoriented. No cranial nerve deficit or sensory deficit. He exhibits normal muscle tone. Coordination and gait normal.  Skin: Skin is warm and dry. He is not diaphoretic. No pallor.  Psychiatric: His behavior is normal.  Vitals reviewed.   No results found for this or any previous visit (from the past 72 hour(s)).  No results found.  ASSESSMENT AND PLAN:  Anis was seen today for annual exam.  Diagnoses and all orders for this visit:  Annual physical exam: Healthy male with reported history of hepatitis, however no information exist to why.  Labs out.  He will sign up for mychart.  Need for hepatitis B screening test -     Acute Hep Panel & Hep B Surface Ab  Screening for HIV (human immunodeficiency virus) -     HIV antibody  Screening for deficiency anemia -     CBC  Screening for nephropathy -     CMP14+EGFR -     POCT urinalysis dipstick  Screening for diabetes mellitus -     Hemoglobin A1c  Routine screening for STI (sexually transmitted infection) -     GC/Chlamydia Probe Amp -     Trichomonas vaginalis, RNA    The patient is advised to call or return to clinic if he does not see an improvement in symptoms, or to seek the care of the closest emergency department if he worsens with the above plan.   Philis Fendt, MHS, PA-C [facility] 03/21/2017 9:09 AM

## 2017-03-21 NOTE — Patient Instructions (Signed)
     IF you received an x-ray today, you will receive an invoice from [facility]. Please contact [facility] at [phone] with questions or concerns regarding your invoice.   IF you received labwork today, you will receive an invoice from [facility]. Please contact [facility] at [phone] with questions or concerns regarding your invoice.   Our billing staff will not be able to assist you with questions regarding bills from these companies.  You will be contacted with the lab results as soon as they are available. The fastest way to get your results is to activate your My Chart account. Instructions are located on the last page of this paperwork. If you have not heard from us regarding the results in 2 weeks, please contact this office.     

## 2017-03-22 LAB — CBC
Hematocrit: 42.7 % (ref 37.5–51.0)
Hemoglobin: 14 g/dL (ref 13.0–17.7)
MCH: 26.6 pg (ref 26.6–33.0)
MCHC: 32.8 g/dL (ref 31.5–35.7)
MCV: 81 fL (ref 79–97)
PLATELETS: 184 10*3/uL (ref 150–379)
RBC: 5.26 x10E6/uL (ref 4.14–5.80)
RDW: 13.8 % (ref 12.3–15.4)
WBC: 4.1 10*3/uL (ref 3.4–10.8)

## 2017-03-22 LAB — CMP14+EGFR
A/G RATIO: 1.9 (ref 1.2–2.2)
ALK PHOS: 101 IU/L (ref 39–117)
ALT: 45 IU/L — AB (ref 0–44)
AST: 22 IU/L (ref 0–40)
Albumin: 4.8 g/dL (ref 3.5–5.5)
BILIRUBIN TOTAL: 0.7 mg/dL (ref 0.0–1.2)
BUN/Creatinine Ratio: 14 (ref 9–20)
BUN: 15 mg/dL (ref 6–20)
CHLORIDE: 100 mmol/L (ref 96–106)
CO2: 25 mmol/L (ref 18–29)
Calcium: 9.7 mg/dL (ref 8.7–10.2)
Creatinine, Ser: 1.06 mg/dL (ref 0.76–1.27)
GFR calc non Af Amer: 89 mL/min/{1.73_m2} (ref >59)
GFR, EST AFRICAN AMERICAN: 103 mL/min/{1.73_m2} (ref >59)
GLUCOSE: 84 mg/dL (ref 65–99)
Globulin, Total: 2.5 g/dL (ref 1.5–4.5)
POTASSIUM: 4.4 mmol/L (ref 3.5–5.2)
Sodium: 139 mmol/L (ref 134–144)
Total Protein: 7.3 g/dL (ref 6.0–8.5)

## 2017-03-22 LAB — HIV ANTIBODY (ROUTINE TESTING W REFLEX): HIV Screen 4th Generation wRfx: NONREACTIVE

## 2017-03-22 LAB — ACUTE HEP PANEL AND HEP B SURFACE AB
HEP A IGM: NEGATIVE
HEP B C IGM: NEGATIVE
HEP B S AG: NEGATIVE
HEPATITIS B SURF AB QUANT: 111.7 m[IU]/mL

## 2017-03-22 LAB — HEMOGLOBIN A1C
Est. average glucose Bld gHb Est-mCnc: 123 mg/dL
HEMOGLOBIN A1C: 5.9 % — AB (ref 4.8–5.6)

## 2017-03-24 LAB — GC/CHLAMYDIA PROBE AMP
CHLAMYDIA, DNA PROBE: NEGATIVE
NEISSERIA GONORRHOEAE BY PCR: NEGATIVE

## 2017-03-24 LAB — TRICHOMONAS VAGINALIS, PROBE AMP: TRICH VAG BY NAA: NEGATIVE

## 2017-04-10 ENCOUNTER — Encounter: Payer: Self-pay | Admitting: Physician Assistant

## 2017-04-24 NOTE — Telephone Encounter (Signed)
Pt is requesting Clark to answer his question about the positive hep test at the other urgent care. Please advise

## 2017-04-29 ENCOUNTER — Encounter: Payer: Self-pay | Admitting: Urgent Care

## 2017-04-29 ENCOUNTER — Ambulatory Visit (INDEPENDENT_AMBULATORY_CARE_PROVIDER_SITE_OTHER): Payer: BLUE CROSS/BLUE SHIELD | Admitting: Urgent Care

## 2017-04-29 VITALS — BP 126/74 | HR 64 | Resp 16 | Ht 66.5 in | Wt 181.2 lb

## 2017-04-29 DIAGNOSIS — Z23 Encounter for immunization: Secondary | ICD-10-CM

## 2017-04-29 NOTE — Patient Instructions (Signed)
Tdap Vaccine (Tetanus, Diphtheria and Pertussis): What You Need to Know 1. Why get vaccinated? Tetanus, diphtheria and pertussis are very serious diseases. Tdap vaccine can protect us from these diseases. And, Tdap vaccine given to pregnant women can protect newborn babies against pertussis. TETANUS (Lockjaw) is rare in the [place] today. It causes painful muscle tightening and stiffness, usually all over the body.  It can lead to tightening of muscles in the head and neck so you can't open your mouth, swallow, or sometimes even breathe. Tetanus kills about 1 out of 10 people who are infected even after receiving the best medical care.  DIPHTHERIA is also rare in the [place] today. It can cause a thick coating to form in the back of the throat.  It can lead to breathing problems, heart failure, paralysis, and death.  PERTUSSIS (Whooping Cough) causes severe coughing spells, which can cause difficulty breathing, vomiting and disturbed sleep.  It can also lead to weight loss, incontinence, and rib fractures. Up to 2 in 100 adolescents and 5 in 100 adults with pertussis are hospitalized or have complications, which could include pneumonia or death.  These diseases are caused by bacteria. Diphtheria and pertussis are spread from person to person through secretions from coughing or sneezing. Tetanus enters the body through cuts, scratches, or wounds. Before vaccines, as many as 200,000 cases of diphtheria, 200,000 cases of pertussis, and hundreds of cases of tetanus, were reported in the [place] each year. Since vaccination began, reports of cases for tetanus and diphtheria have dropped by about 99% and for pertussis by about 80%. 2. Tdap vaccine Tdap vaccine can protect adolescents and adults from tetanus, diphtheria, and pertussis. One dose of Tdap is routinely given at age 11 or 12. People who did not get Tdap at that age should get it as soon as possible. Tdap is especially  important for healthcare professionals and anyone having close contact with a baby younger than 12 months. Pregnant women should get a dose of Tdap during every pregnancy, to protect the newborn from pertussis. Infants are most at risk for severe, life-threatening complications from pertussis. Another vaccine, called Td, protects against tetanus and diphtheria, but not pertussis. A Td booster should be given every 10 years. Tdap may be given as one of these boosters if you have never gotten Tdap before. Tdap may also be given after a severe cut or burn to prevent tetanus infection. Your doctor or the person giving you the vaccine can give you more information. Tdap may safely be given at the same time as other vaccines. 3. Some people should not get this vaccine  A person who has ever had a life-threatening allergic reaction after a previous dose of any diphtheria, tetanus or pertussis containing vaccine, OR has a severe allergy to any part of this vaccine, should not get Tdap vaccine. Tell the person giving the vaccine about any severe allergies.  Anyone who had coma or long repeated seizures within 7 days after a childhood dose of DTP or DTaP, or a previous dose of Tdap, should not get Tdap, unless a cause other than the vaccine was found. They can still get Td.  Talk to your doctor if you: ? have seizures or another nervous system problem, ? had severe pain or swelling after any vaccine containing diphtheria, tetanus or pertussis, ? ever had a condition called Guillain-Barr Syndrome (GBS), ? aren't feeling well on the day the shot is scheduled. 4. Risks With any medicine, including   vaccines, there is a chance of side effects. These are usually mild and go away on their own. Serious reactions are also possible but are rare. Most people who get Tdap vaccine do not have any problems with it. Mild problems following Tdap: (Did not interfere with activities)  Pain where the shot was given (about  3 in 4 adolescents or 2 in 3 adults)  Redness or swelling where the shot was given (about 1 person in 5)  Mild fever of at least 100.4F (up to about 1 in 25 adolescents or 1 in 100 adults)  Headache (about 3 or 4 people in 10)  Tiredness (about 1 person in 3 or 4)  Nausea, vomiting, diarrhea, stomach ache (up to 1 in 4 adolescents or 1 in 10 adults)  Chills, sore joints (about 1 person in 10)  Body aches (about 1 person in 3 or 4)  Rash, swollen glands (uncommon)  Moderate problems following Tdap: (Interfered with activities, but did not require medical attention)  Pain where the shot was given (up to 1 in 5 or 6)  Redness or swelling where the shot was given (up to about 1 in 16 adolescents or 1 in 12 adults)  Fever over 102F (about 1 in 100 adolescents or 1 in 250 adults)  Headache (about 1 in 7 adolescents or 1 in 10 adults)  Nausea, vomiting, diarrhea, stomach ache (up to 1 or 3 people in 100)  Swelling of the entire arm where the shot was given (up to about 1 in 500).  Severe problems following Tdap: (Unable to perform usual activities; required medical attention)  Swelling, severe pain, bleeding and redness in the arm where the shot was given (rare).  Problems that could happen after any vaccine:  People sometimes faint after a medical procedure, including vaccination. Sitting or lying down for about 15 minutes can help prevent fainting, and injuries caused by a fall. Tell your doctor if you feel dizzy, or have vision changes or ringing in the ears.  Some people get severe pain in the shoulder and have difficulty moving the arm where a shot was given. This happens very rarely.  Any medication can cause a severe allergic reaction. Such reactions from a vaccine are very rare, estimated at fewer than 1 in a million doses, and would happen within a few minutes to a few hours after the vaccination. As with any medicine, there is a very remote chance of a vaccine  causing a serious injury or death. The safety of vaccines is always being monitored. For more information, visit: www.cdc.gov/vaccinesafety/ 5. What if there is a serious problem? What should I look for? Look for anything that concerns you, such as signs of a severe allergic reaction, very high fever, or unusual behavior. Signs of a severe allergic reaction can include hives, swelling of the face and throat, difficulty breathing, a fast heartbeat, dizziness, and weakness. These would usually start a few minutes to a few hours after the vaccination. What should I do?  If you think it is a severe allergic reaction or other emergency that can't wait, call 9-1-1 or get the person to the nearest hospital. Otherwise, call your doctor.  Afterward, the reaction should be reported to the Vaccine Adverse Event Reporting System (VAERS). Your doctor might file this report, or you can do it yourself through the VAERS web site at www.vaers.hhs.gov, or by calling 1-800-822-7967. ? VAERS does not give medical advice. 6. The National Vaccine Injury Compensation Program The National   Vaccine Injury Compensation Program (VICP) is a federal program that was created to compensate people who may have been injured by certain vaccines. Persons who believe they may have been injured by a vaccine can learn about the program and about filing a claim by calling 1-800-338-2382 or visiting the VICP website at www.hrsa.gov/vaccinecompensation. There is a time limit to file a claim for compensation. 7. How can I learn more?  Ask your doctor. He or she can give you the vaccine package insert or suggest other sources of information.  Call your local or state health department.  Contact the Centers for Disease Control and Prevention (CDC): ? Call 1-800-232-4636 (1-800-CDC-INFO) or ? Visit CDC's website at www.cdc.gov/vaccines CDC Tdap Vaccine VIS (12/06/13) This information is not intended to replace advice given to you by your  health care provider. Make sure you discuss any questions you have with your health care provider. Document Released: 03/30/2012 Document Revised: 06/19/2016 Document Reviewed: 06/19/2016 Elsevier Interactive Patient Education  2017 [employer]  

## 2017-04-29 NOTE — Progress Notes (Signed)
    MRN: [id number] DOB: 08/07/1981  Subjective:   Samuel Bradford is a 36 y.o. male presenting for follow up on immunizations. Patient had annual exam on 03/21/2017. He is presenting today to complete immunization updates. Will get a tdap today. Denies any previous reactions to the vaccine.    Samuel Bradford has a current medication list which includes the following prescription(s): cyclobenzaprine and naproxen sodium. Also has No Known Allergies. Samuel Bradford denies past medical history. Also  has a past surgical history that includes head surgery.  Objective:   Vitals: BP 126/74 (BP Location: Right Arm, Patient Position: Sitting, Cuff Size: Large)   Pulse 64   Resp 16   Ht 5' 6.5" (1.689 m)   Wt 181 lb 3.2 oz (82.2 kg)   SpO2 100%   BMI 28.81 kg/m   Physical Exam  Constitutional: He is oriented to person, place, and time. He appears well-developed and well-nourished.  Cardiovascular: Normal rate.   Pulmonary/Chest: Effort normal.  Neurological: He is alert and oriented to person, place, and time.  Psychiatric: He has a normal mood and affect.   Assessment and Plan :   1. Encounter for immunization 2. Need for Tdap vaccination - Follow up as needed. - Tdap vaccine greater than or equal to 7yo IM   Samuel BambergMario Shemaiah Round, PA-C [facility] [phone] 04/29/2017 12:05 PM

## 2018-03-17 IMAGING — DX DG LUMBAR SPINE COMPLETE 4+V
5 series · 5 of 5 positions shown · non-contrast
Comparison: Coronal and sagittal images through the lumbar spine
from an abdominal and pelvic CT scan dated June 20, 2015

CLINICAL DATA: Acute bilateral low back pain without sciatica

EXAM:
LUMBAR SPINE - COMPLETE 4+ VIEW

[l-spine ap]
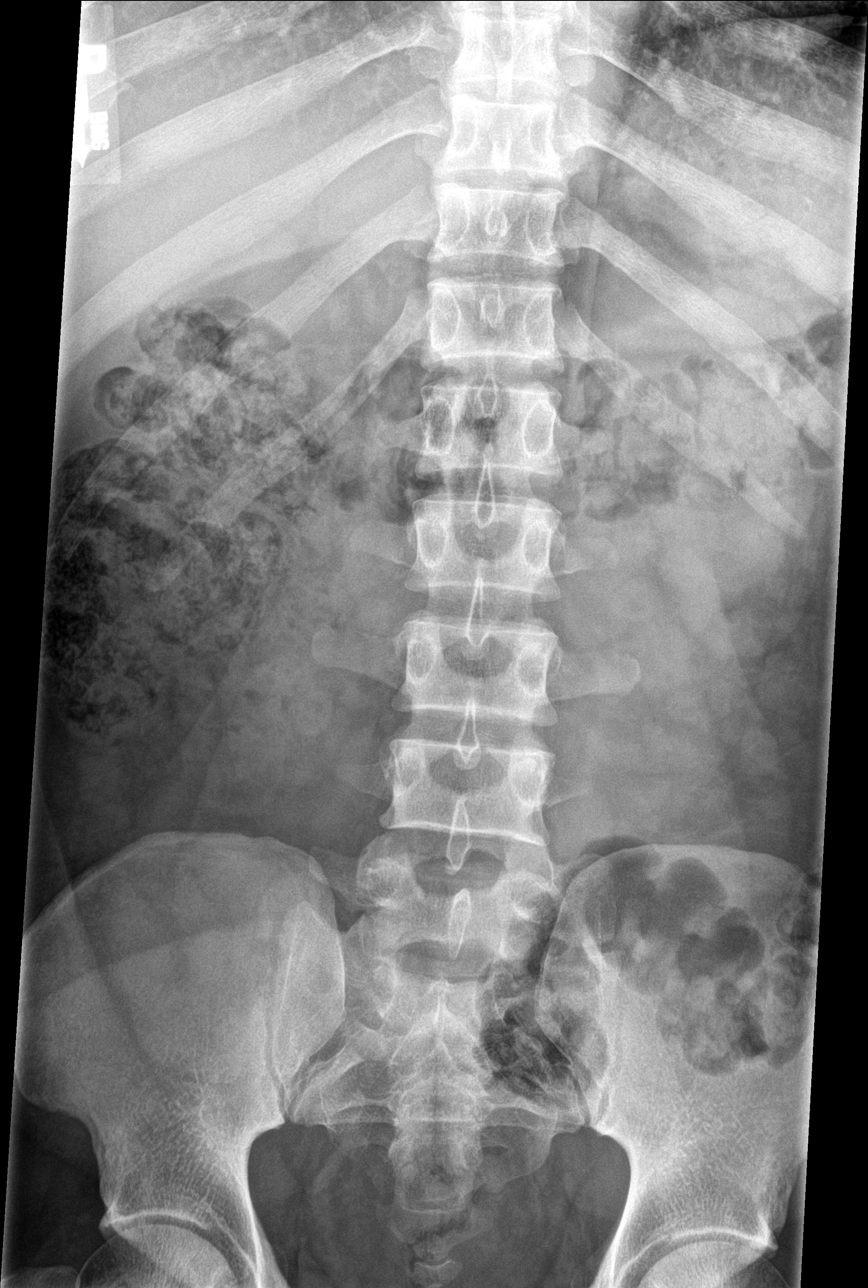

[l-spine obl (1 of 2)]
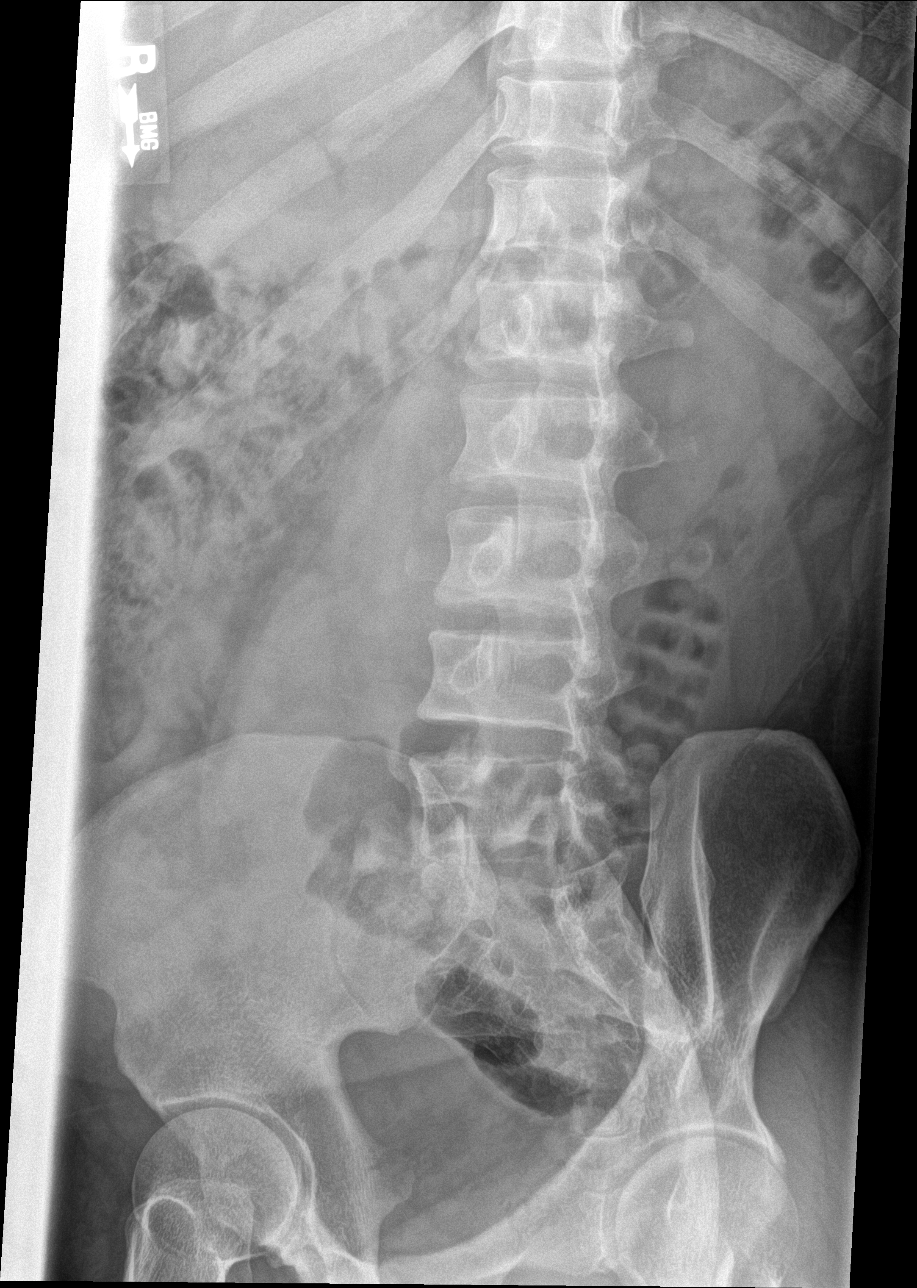

[l-spine obl (2 of 2)]
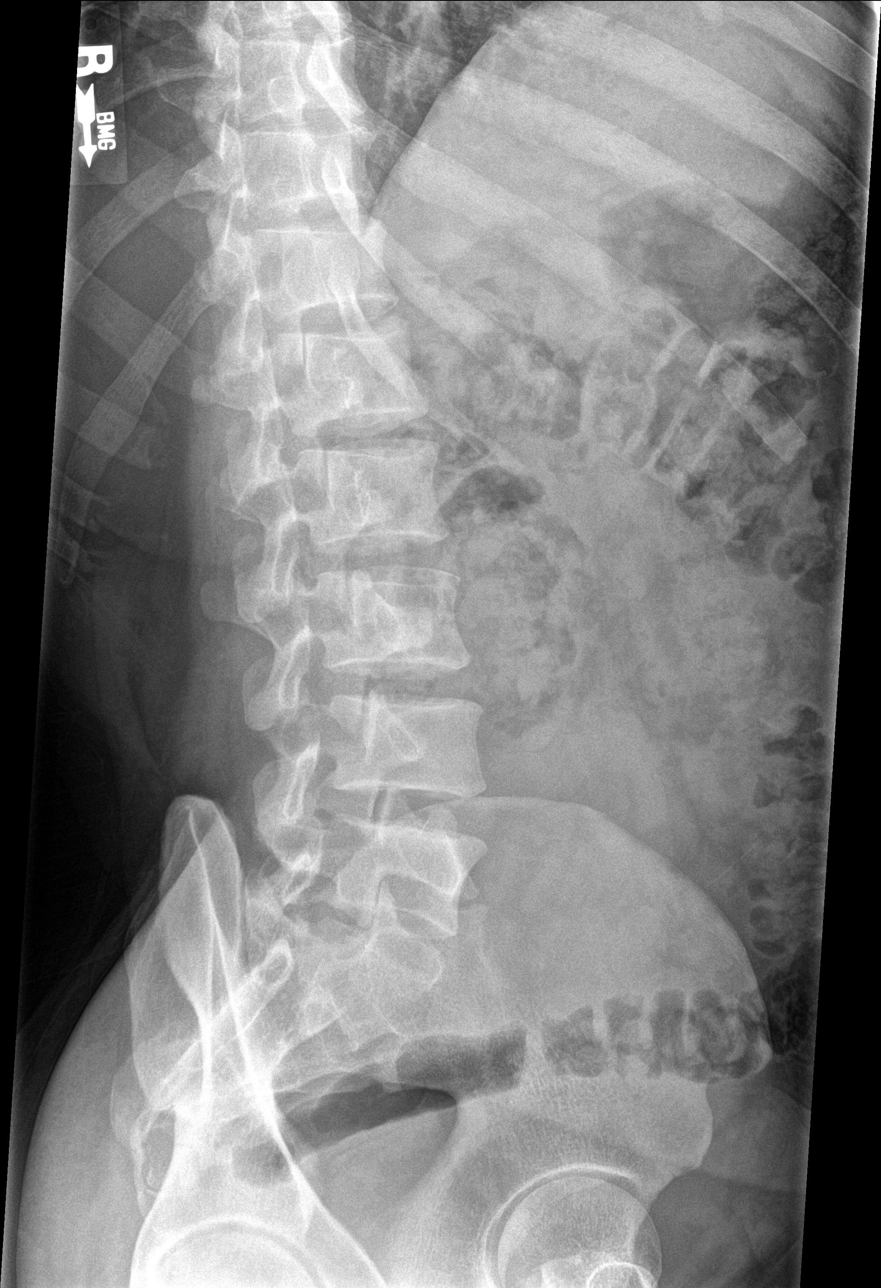

[l-spine lat]
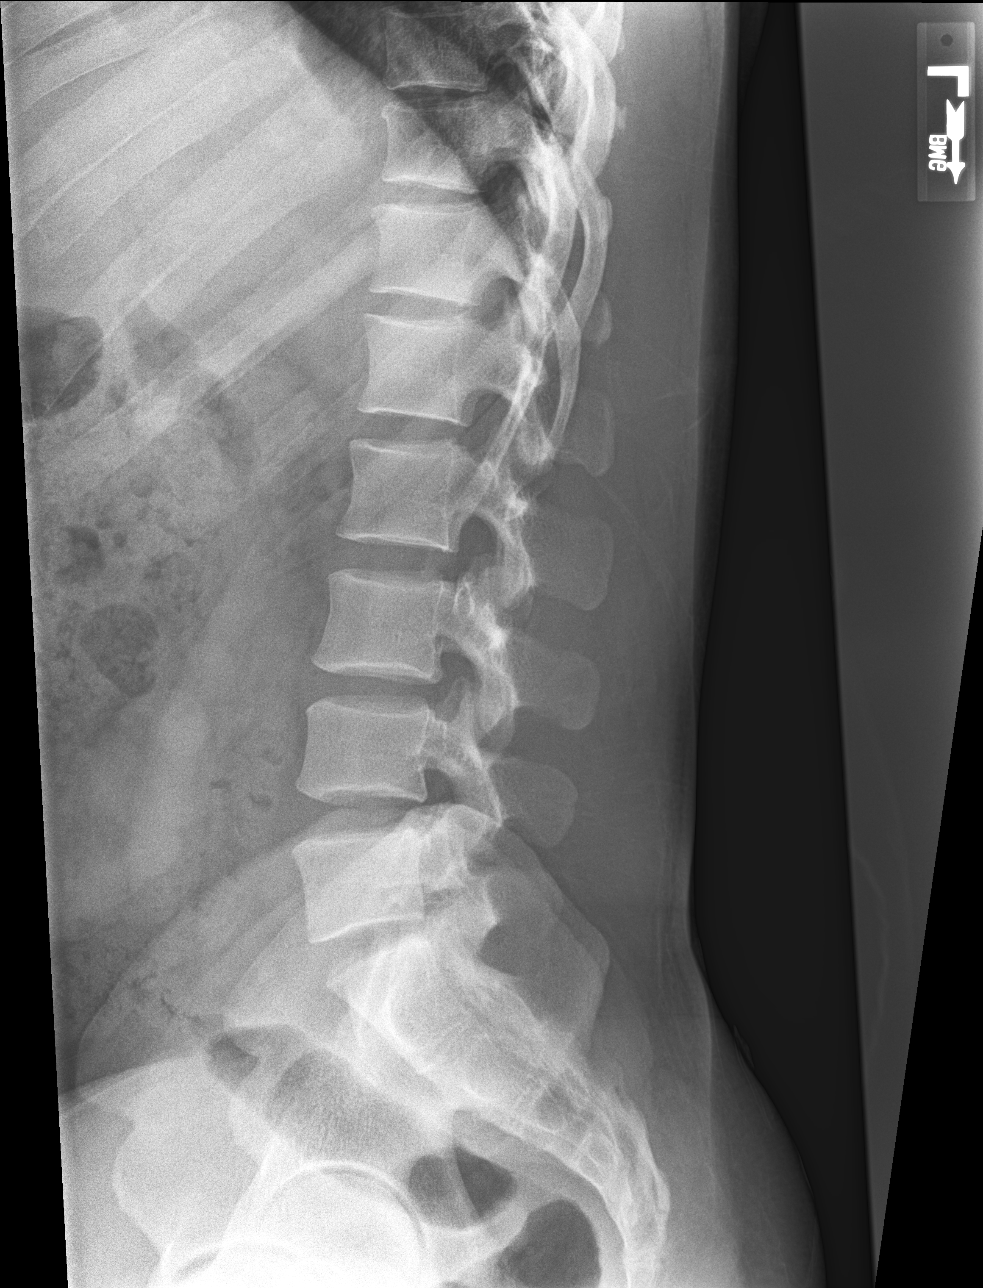

[l-spine l5-s1]
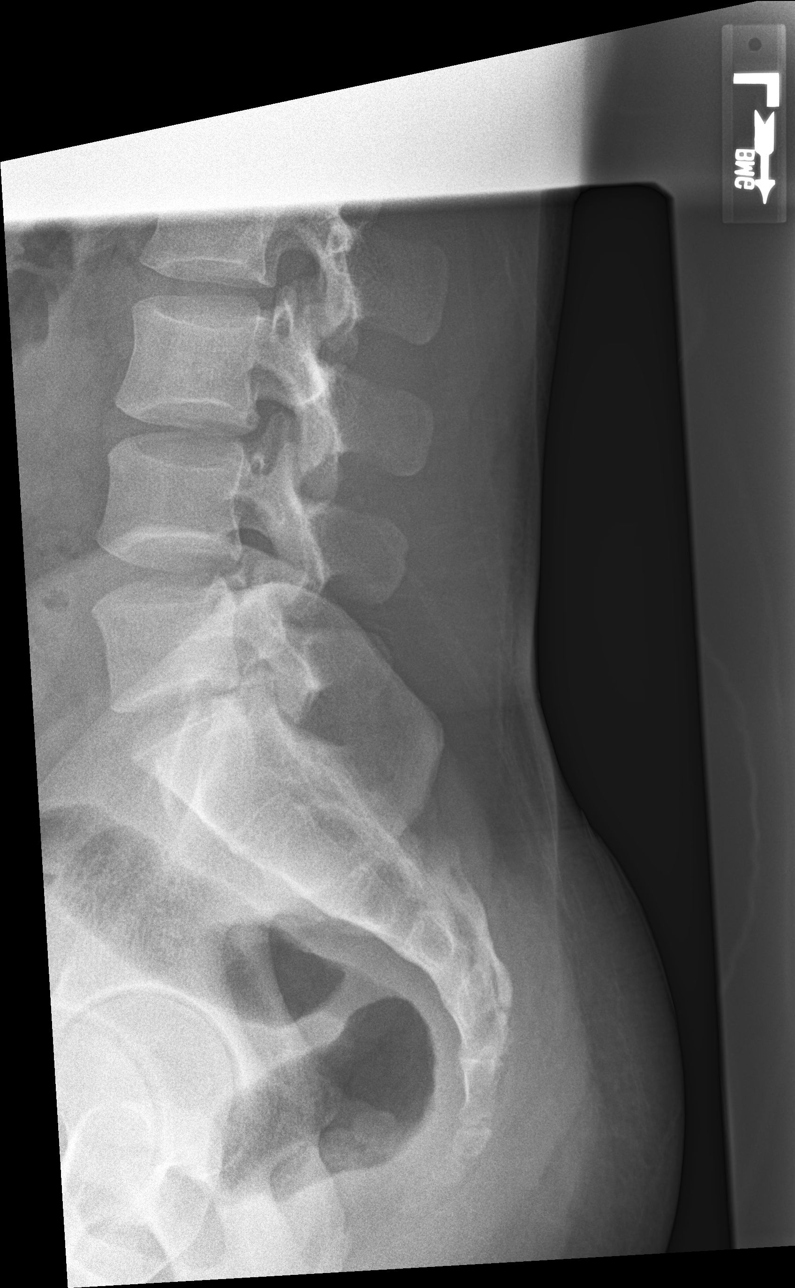

[5 of 5 positions shown; findings below may reference images not displayed]

FINDINGS: The lumbar vertebral bodies are preserved in height. The pedicles
and transverse processes are intact. The disc space heights are well
maintained. There is no spondylolisthesis. The observed portions of
the sacrum are normal.
IMPRESSION: There is no acute or significant chronic bony abnormality of the
lumbar spine.
